# Patient Record
Sex: Male | Born: 1995 | Race: White | Hispanic: Yes | Marital: Single | Smoking: Never smoker
Health system: Southern US, Community
[De-identification: ages and names within clinical notes are randomized; demographics above are authoritative.]

## PROBLEM LIST (undated history)

## (undated) DIAGNOSIS — R6250 Unspecified lack of expected normal physiological development in childhood: Secondary | ICD-10-CM

## (undated) DIAGNOSIS — J45909 Unspecified asthma, uncomplicated: Secondary | ICD-10-CM

## (undated) HISTORY — DX: Unspecified asthma, uncomplicated: J45.909

## (undated) HISTORY — DX: Unspecified lack of expected normal physiological development in childhood: R62.50

---
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
## 2017-01-31 ENCOUNTER — Ambulatory Visit (INDEPENDENT_AMBULATORY_CARE_PROVIDER_SITE_OTHER): Payer: Medicaid Other | Admitting: Family Medicine

## 2017-01-31 ENCOUNTER — Encounter: Payer: Self-pay | Admitting: Family Medicine

## 2017-01-31 VITALS — Temp 98.9°F | Ht 66.0 in | Wt 258.5 lb

## 2017-01-31 DIAGNOSIS — J111 Influenza due to unidentified influenza virus with other respiratory manifestations: Secondary | ICD-10-CM | POA: Diagnosis not present

## 2017-01-31 MED ORDER — OSELTAMIVIR PHOSPHATE 75 MG PO CAPS: 75.0000 mg | ORAL_CAPSULE | Freq: Two times a day (BID) | ORAL | 0 refills | Status: AC

## 2017-01-31 MED ORDER — OSELTAMIVIR PHOSPHATE 75 MG PO CAPS: 75.0000 mg | ORAL_CAPSULE | Freq: Two times a day (BID) | ORAL | 0 refills | Status: DC

## 2017-01-31 NOTE — Patient Instructions (Signed)

## 2017-01-31 NOTE — Progress Notes (Signed)
   Subjective:    Patient ID: Derrick Soto, male    DOB: 1996/10/18, 21 y.o.   MRN: [id number]  URI   This is a new problem. The current episode started in the past 7 days. The problem has been unchanged. Associated symptoms include congestion, coughing, ear pain, headaches, rhinorrhea and a sore throat. Pertinent negatives include no chest pain or wheezing. Associated symptoms comments: Fever . Treatments tried: nyquil, dayquil. The treatment provided no relief.  Patient is only had a couple days a sore throat runny nose body aches fever chills sweats cough he was exposed to the flu.    Review of Systems  Constitutional: Negative for activity change and fever.  HENT: Positive for congestion, ear pain, rhinorrhea and sore throat.   Eyes: Negative for discharge.  Respiratory: Positive for cough. Negative for wheezing.   Cardiovascular: Negative for chest pain.  Neurological: Positive for headaches.       Objective:   Physical Exam  Constitutional: He appears well-developed.  HENT:  Head: Normocephalic.  Mouth/Throat: Oropharynx is clear and moist. No oropharyngeal exudate.  Neck: Normal range of motion.  Cardiovascular: Normal rate, regular rhythm and normal heart sounds.   No murmur heard. Pulmonary/Chest: Effort normal and breath sounds normal. He has no wheezes.  Lymphadenopathy:    He has no cervical adenopathy.  Neurological: He exhibits normal muscle tone.  Skin: Skin is warm and dry.  Nursing note and vitals reviewed.    I find no evidence of pneumonia. I do not recommend x-rays or lab work     Assessment & Plan:  Influenza-the patient was diagnosed with influenza. Patient/family educated about the flu and warning signs to watch for. If difficulty breathing, severe neck pain and stiffness, cyanosis, disorientation, or progressive worsening then immediately get rechecked at that ER. If progressive symptoms be certain to be rechecked. Supportive measures such  as Tylenol/ibuprofen was discussed. No aspirin use in children. And influenza home care instruction sheet was given. Tamiflu prescribed warning signs discussed follow-up if problems

## 2018-05-20 ENCOUNTER — Encounter: Payer: Self-pay | Admitting: Family Medicine

## 2018-05-20 ENCOUNTER — Ambulatory Visit (INDEPENDENT_AMBULATORY_CARE_PROVIDER_SITE_OTHER): Payer: Self-pay | Admitting: Family Medicine

## 2018-05-20 VITALS — BP 124/76 | Temp 98.3°F | Ht 66.0 in | Wt 245.4 lb

## 2018-05-20 DIAGNOSIS — J310 Chronic rhinitis: Secondary | ICD-10-CM

## 2018-05-20 DIAGNOSIS — J4521 Mild intermittent asthma with (acute) exacerbation: Secondary | ICD-10-CM

## 2018-05-20 DIAGNOSIS — J329 Chronic sinusitis, unspecified: Secondary | ICD-10-CM

## 2018-05-20 MED ORDER — ALBUTEROL SULFATE HFA 108 (90 BASE) MCG/ACT IN AERS: 2.0000 | INHALATION_SPRAY | RESPIRATORY_TRACT | 2 refills | Status: AC | PRN

## 2018-05-20 MED ORDER — LORATADINE 10 MG PO TABS: 10.0000 mg | ORAL_TABLET | Freq: Every day | ORAL | 2 refills | Status: AC

## 2018-05-20 MED ORDER — AMOXICILLIN-POT CLAVULANATE 875-125 MG PO TABS: 1.0000 | ORAL_TABLET | Freq: Two times a day (BID) | ORAL | 0 refills | Status: AC

## 2018-05-20 MED ORDER — BECLOMETHASONE DIPROPIONATE 80 MCG/ACT IN AERS: 1.0000 | INHALATION_SPRAY | Freq: Two times a day (BID) | RESPIRATORY_TRACT | 2 refills | Status: AC

## 2018-05-20 MED ORDER — FLUTICASONE PROPIONATE 50 MCG/ACT NA SUSP: 2.0000 | Freq: Every day | NASAL | 2 refills | Status: AC

## 2018-05-20 NOTE — Progress Notes (Signed)
   Subjective:    Patient ID: Derrick Soto, male    DOB: September 29, 1996, 22 y.o.   MRN: [id number]  Headache   This is a new problem. The current episode started in the past 7 days. Associated symptoms include coughing, ear pain, a fever, rhinorrhea and a sore throat. Associated symptoms comments: Wheezing, coughing up green yellowish phelgm. Treatments tried: Dayquil Nyquil. The treatment provided no relief.  Needs refills on all meds except Naproxen  Sat started , pos prod and yellow green phlegm   And nasal cpong  Review of Systems  Constitutional: Positive for fever.  HENT: Positive for ear pain, rhinorrhea and sore throat.   Respiratory: Positive for cough.   Neurological: Positive for headaches.       Objective:   Physical Exam  Alert, mild malaise. Hydration good Vitals stable. frontal/ maxillary tenderness evident positive nasal congestion. pharynx normal neck supple  lungs clear/no crackles positive wheezes. heart regular in rhythm       Assessment & Plan:  Impression rhinosinusitis with bronchitis and exacerbation asthma likely post viral, discussed with patient. plan antibiotics prescribed. Questions answered. Symptomatic care discussed. warning signs discussed. WSL

## 2020-01-20 ENCOUNTER — Encounter: Payer: Self-pay | Admitting: Family Medicine

## 2020-01-21 ENCOUNTER — Encounter: Payer: Self-pay | Admitting: Family Medicine

## 2023-08-13 DIAGNOSIS — Z6841 Body Mass Index (BMI) 40.0 and over, adult: Secondary | ICD-10-CM | POA: Diagnosis not present

## 2023-08-13 DIAGNOSIS — H6503 Acute serous otitis media, bilateral: Secondary | ICD-10-CM | POA: Diagnosis not present

## 2023-08-13 DIAGNOSIS — R030 Elevated blood-pressure reading, without diagnosis of hypertension: Secondary | ICD-10-CM | POA: Diagnosis not present

## 2023-11-26 DIAGNOSIS — R030 Elevated blood-pressure reading, without diagnosis of hypertension: Secondary | ICD-10-CM | POA: Diagnosis not present

## 2023-11-26 DIAGNOSIS — J22 Unspecified acute lower respiratory infection: Secondary | ICD-10-CM | POA: Diagnosis not present

## 2023-11-26 DIAGNOSIS — R059 Cough, unspecified: Secondary | ICD-10-CM | POA: Diagnosis not present

## 2023-11-27 DIAGNOSIS — H6990 Unspecified Eustachian tube disorder, unspecified ear: Secondary | ICD-10-CM | POA: Diagnosis not present

## 2023-11-27 DIAGNOSIS — J069 Acute upper respiratory infection, unspecified: Secondary | ICD-10-CM | POA: Diagnosis not present
